# Patient Record
Sex: Female | Born: 1995 | Race: Black or African American | Hispanic: No | Marital: Single | Smoking: Never smoker
Health system: Southern US, Community
[De-identification: ages and names within clinical notes are randomized; demographics above are authoritative.]

---

## 2020-07-18 ENCOUNTER — Ambulatory Visit (HOSPITAL_COMMUNITY)
Admission: EM | Admit: 2020-07-18 | Discharge: 2020-07-18 | Disposition: A | Discharge status: Home / Self Care | Payer: BC Managed Care – PPO | Attending: Physician Assistant | Admitting: Physician Assistant

## 2020-07-18 ENCOUNTER — Encounter (HOSPITAL_COMMUNITY): Payer: Self-pay

## 2020-07-18 DIAGNOSIS — K529 Noninfective gastroenteritis and colitis, unspecified: Secondary | ICD-10-CM | POA: Diagnosis not present

## 2020-07-18 DIAGNOSIS — R197 Diarrhea, unspecified: Secondary | ICD-10-CM | POA: Insufficient documentation

## 2020-07-18 DIAGNOSIS — R112 Nausea with vomiting, unspecified: Secondary | ICD-10-CM

## 2020-07-18 LAB — CBC WITH DIFFERENTIAL/PLATELET
Abs Immature Granulocytes: 0.01 10*3/uL (ref 0.00–0.07)
Basophils Absolute: 0 10*3/uL (ref 0.0–0.1)
Basophils Relative: 1 %
Eosinophils Absolute: 0.1 10*3/uL (ref 0.0–0.5)
Eosinophils Relative: 1 %
HCT: 40.7 % (ref 36.0–46.0)
Hemoglobin: 14.2 g/dL (ref 12.0–15.0)
Immature Granulocytes: 0 %
Lymphocytes Relative: 24 %
Lymphs Abs: 0.9 10*3/uL (ref 0.7–4.0)
MCH: 29.5 pg (ref 26.0–34.0)
MCHC: 34.9 g/dL (ref 30.0–36.0)
MCV: 84.4 fL (ref 80.0–100.0)
Monocytes Absolute: 0.9 10*3/uL (ref 0.1–1.0)
Monocytes Relative: 22 %
Neutro Abs: 2 10*3/uL (ref 1.7–7.7)
Neutrophils Relative %: 52 %
Platelets: 251 10*3/uL (ref 150–400)
RBC: 4.82 MIL/uL (ref 3.87–5.11)
RDW: 11.9 % (ref 11.5–15.5)
WBC: 3.8 10*3/uL — ABNORMAL LOW (ref 4.0–10.5)
nRBC: 0 % (ref 0.0–0.2)

## 2020-07-18 LAB — POCT URINALYSIS DIPSTICK, ED / UC
Glucose, UA: NEGATIVE mg/dL
Hgb urine dipstick: NEGATIVE
Ketones, ur: 15 mg/dL — AB
Leukocytes,Ua: NEGATIVE
Nitrite: NEGATIVE
Protein, ur: NEGATIVE mg/dL
Specific Gravity, Urine: 1.025 (ref 1.005–1.030)
Urobilinogen, UA: 0.2 mg/dL (ref 0.0–1.0)
pH: 6 (ref 5.0–8.0)

## 2020-07-18 LAB — COMPREHENSIVE METABOLIC PANEL
ALT: 21 U/L (ref 0–44)
AST: 22 U/L (ref 15–41)
Albumin: 4.4 g/dL (ref 3.5–5.0)
Alkaline Phosphatase: 44 U/L (ref 38–126)
Anion gap: 7 (ref 5–15)
BUN: 11 mg/dL (ref 6–20)
CO2: 24 mmol/L (ref 22–32)
Calcium: 9.2 mg/dL (ref 8.9–10.3)
Chloride: 104 mmol/L (ref 98–111)
Creatinine, Ser: 0.95 mg/dL (ref 0.44–1.00)
GFR, Estimated: >60 mL/min (ref >60)
Glucose, Bld: 95 mg/dL (ref 70–99)
Potassium: 3.5 mmol/L (ref 3.5–5.1)
Sodium: 135 mmol/L (ref 135–145)
Total Bilirubin: 0.8 mg/dL (ref 0.3–1.2)
Total Protein: 7.9 g/dL (ref 6.5–8.1)

## 2020-07-18 LAB — POC URINE PREG, ED: Preg Test, Ur: NEGATIVE

## 2020-07-18 MED ORDER — FAMOTIDINE 20 MG PO TABS: 20.0000 mg | ORAL_TABLET | Freq: Two times a day (BID) | ORAL | 0 refills | Status: AC

## 2020-07-18 MED ORDER — ONDANSETRON 8 MG PO TBDP: 8.0000 mg | ORAL_TABLET | Freq: Three times a day (TID) | ORAL | 0 refills | Status: AC | PRN

## 2020-07-18 MED ORDER — ONDANSETRON 4 MG PO TBDP
4.0000 mg | ORAL_TABLET | Freq: Once | ORAL | Status: AC
  Administered 2020-07-18: 4 mg via ORAL

## 2020-07-18 MED ORDER — ONDANSETRON 4 MG PO TBDP
ORAL_TABLET | ORAL | Status: AC
  Filled 2020-07-18: qty 1

## 2020-07-18 NOTE — ED Provider Notes (Signed)
[facility]    CSN: [id number] Arrival date & time: 07/18/20  1142      History   Chief Complaint Chief Complaint  Patient presents with   Abdominal Pain   Nausea   Emesis   Diarrhea    HPI Kelsey Morales is a 25 y.o. female.   Patient presents today with a 2-day history of abdominal symptoms.  Reports generalized abdominal pain that is worse on the left side with pain, rated 3 on a 0-10 pain scale, described as aching, no aggravating or alleviating factors identified.  Patient reports associated diarrhea described as 5+ watery bowel movements per day without blood or mucus.  She reports persistent nausea with one episode of emesis but denies any hematemesis.  She denies any known sick contacts, recent antibiotic use, medication changes, recent travel, suspicious food intake.  Denies history of gastrointestinal disorder.  She does report taking a detox prior to symptom onset and wonders if this could have caused abdominal symptoms.  Reports active ingredient in the detox with fruit pectin.  She has been able to drink fluids regularly and has no concern for dehydration.  She did take an at-home pregnancy test that was negative and has IUD in place but is open to repeat pregnancy testing if appropriate.   History reviewed. No pertinent past medical history.  There are no problems to display for this patient.   History reviewed. No pertinent surgical history.  OB History   No obstetric history on file.      Home Medications    Prior to Admission medications   Medication Sig Start Date End Date Taking? Authorizing Provider  famotidine (PEPCID) 20 MG tablet Take 1 tablet (20 mg total) by mouth 2 (two) times daily. 07/18/20  Yes Lindbergh Winkles K, PA-C  ondansetron (ZOFRAN ODT) 8 MG disintegrating tablet Take 1 tablet (8 mg total) by mouth every 8 (eight) hours as needed for nausea or vomiting. 07/18/20  Yes Melicia Esqueda, Noberto Retort, PA-C  levonorgestrel (KYLEENA) 19.5 MG IUD Kyleena  17.5 mcg/24 hrs (48yrs) 19.5mg  intrauterine device  Device supplied by care center.    [provider]    Family History Family History  Problem Relation Age of Onset   Healthy Mother    Healthy Father     Social History Social History   Tobacco Use   Smoking status: Never   Smokeless tobacco: Never  Vaping Use   Vaping Use: Never used  Substance Use Topics   Alcohol use: Yes   Drug use: Not Currently     Allergies   Patient has no allergy information on record.   Review of Systems Review of Systems  Constitutional:  Positive for appetite change. Negative for activity change, fatigue and fever.  Respiratory:  Negative for cough and shortness of breath.   Cardiovascular:  Negative for chest pain.  Gastrointestinal:  Positive for abdominal pain, diarrhea, nausea and vomiting. Negative for blood in stool.  Musculoskeletal:  Negative for arthralgias and myalgias.  Neurological:  Negative for dizziness, light-headedness and headaches.    Physical Exam Triage Vital Signs ED Triage Vitals  Enc Vitals Group     BP 07/18/20 1210 107/74     Pulse Rate 07/18/20 1210 79     Resp 07/18/20 1210 16     Temp 07/18/20 1210 98.5 F (36.9 C)     Temp Source 07/18/20 1210 Oral     SpO2 07/18/20 1210 97 %     Weight --  Height --      Head Circumference --      Peak Flow --      Pain Score 07/18/20 1205 3     Pain Loc --      Pain Edu? --      Excl. in GC? --    No data found.  Updated Vital Signs BP 107/74 (BP Location: Left Arm)   Pulse 79   Temp 98.5 F (36.9 C) (Oral)   Resp 16   SpO2 97%   Visual Acuity Right Eye Distance:   Left Eye Distance:   Bilateral Distance:    Right Eye Near:   Left Eye Near:    Bilateral Near:     Physical Exam Vitals reviewed.  Constitutional:      General: She is awake. She is not in acute distress.    Appearance: Normal appearance. She is normal weight. She is not ill-appearing.     Comments: Very pleasant  female appears stated age in no acute distress sitting comfortably in exam room  HENT:     Head: Normocephalic and atraumatic.     Mouth/Throat:     Mouth: Mucous membranes are moist.     Pharynx: Uvula midline. No oropharyngeal exudate or posterior oropharyngeal erythema.  Cardiovascular:     Rate and Rhythm: Normal rate and regular rhythm.     Heart sounds: Normal heart sounds, S1 normal and S2 normal. No murmur heard. Pulmonary:     Effort: Pulmonary effort is normal.     Breath sounds: Normal breath sounds. No wheezing, rhonchi or rales.     Comments: Clear to auscultation bilaterally Abdominal:     General: Bowel sounds are normal.     Palpations: Abdomen is soft.     Tenderness: There is abdominal tenderness in the left upper quadrant and left lower quadrant. There is no right CVA tenderness, left CVA tenderness, guarding or rebound.     Comments: Mild tenderness palpation over left abdomen.  No evidence of acute abdomen on physical exam.  Psychiatric:        Behavior: Behavior is cooperative.     UC Treatments / Results  Labs (all labs ordered are listed, but only abnormal results are displayed) Labs Reviewed  POCT URINALYSIS DIPSTICK, ED / UC - Abnormal; Notable for the following components:      Result Value   Bilirubin Urine SMALL (*)    Ketones, ur 15 (*)    All other components within normal limits  CBC WITH DIFFERENTIAL/PLATELET  COMPREHENSIVE METABOLIC PANEL  POC URINE PREG, ED    EKG   Radiology No results found.  Procedures Procedures (including critical care time)  Medications Ordered in UC Medications  ondansetron (ZOFRAN-ODT) disintegrating tablet 4 mg (4 mg Oral Given 07/18/20 1259)    Initial Impression / Assessment and Plan / UC Course  I have reviewed the triage vital signs and the nursing notes.  Pertinent labs & imaging results that were available during my care of the patient were reviewed by me and considered in my medical decision  making (see chart for details).      Vital signs and physical exam reassuring today; no indication for emergent evaluation or imaging.  Urine pregnancy test was negative.  UA showed evidence patient has not been eating but no significant dehydration.  CBC and CMP obtained today-results pending.  Patient was given 1 dose of Zofran with minimal improvement of symptoms.  She was started on Pepcid twice daily and  given higher dose of Zofran to be used up to 3 times a day as needed for nausea and vomiting symptoms.  Recommend she drink plenty of fluid.  Encouraged her to eat a bland diet and avoid spicy/acidic/fatty foods.  Discussed that we are unable to obtain any imaging so if she has persistent or worsening abdominal symptoms including pain she needs to go to the emergency room.  Discussed alarm symptoms that warrant emergent evaluation.  Strict return precautions given to which patient expressed understanding.  Final Clinical Impressions(s) / UC Diagnoses   Final diagnoses:  Nausea vomiting and diarrhea  Gastroenteritis     Discharge Instructions      Take Pepcid twice a day to help coat your stomach.  Use Zofran up to 3 times a day as needed for nausea and vomiting symptoms.  Drink plenty of fluid.  Eat a bland diet and avoid spicy/acidic/fatty foods.  You have any worsening symptoms including persistent abdominal pain you need to go to the emergency room to consider a CT scan as we discussed.  We will contact you if your lab work is abnormal.  Please follow-up with primary care provider within 1 week.     ED Prescriptions     Medication Sig Dispense Auth. Provider   ondansetron (ZOFRAN ODT) 8 MG disintegrating tablet Take 1 tablet (8 mg total) by mouth every 8 (eight) hours as needed for nausea or vomiting. 20 tablet Arlanda Shiplett K, PA-C   famotidine (PEPCID) 20 MG tablet Take 1 tablet (20 mg total) by mouth 2 (two) times daily. 60 tablet Anthoney Sheppard, Noberto Retort, PA-C      PDMP not reviewed  this encounter.   Jeani Hawking, PA-C 07/18/20 1317

## 2020-07-18 NOTE — ED Triage Notes (Signed)
pt states she is not able to keep food or drinks down, has had some vomitting, with diarrhea. She is having some abdominal cramping. She has been drinking a lot of water to stay hydrated.   Started: 2 days ago  Interventions: none

## 2020-07-18 NOTE — Discharge Instructions (Addendum)
Take Pepcid twice a day to help coat your stomach.  Use Zofran up to 3 times a day as needed for nausea and vomiting symptoms.  Drink plenty of fluid.  Eat a bland diet and avoid spicy/acidic/fatty foods.  You have any worsening symptoms including persistent abdominal pain you need to go to the emergency room to consider a CT scan as we discussed.  We will contact you if your lab work is abnormal.  Please follow-up with primary care provider within 1 week.

## 2020-10-22 ENCOUNTER — Other Ambulatory Visit: Payer: Self-pay

## 2020-10-22 ENCOUNTER — Ambulatory Visit (HOSPITAL_COMMUNITY)
Admission: EM | Admit: 2020-10-22 | Discharge: 2020-10-22 | Disposition: A | Discharge status: Home / Self Care | Payer: BC Managed Care – PPO | Attending: Urgent Care | Admitting: Urgent Care

## 2020-10-22 ENCOUNTER — Encounter (HOSPITAL_COMMUNITY): Payer: Self-pay | Admitting: Emergency Medicine

## 2020-10-22 DIAGNOSIS — J4530 Mild persistent asthma, uncomplicated: Secondary | ICD-10-CM

## 2020-10-22 DIAGNOSIS — R0981 Nasal congestion: Secondary | ICD-10-CM

## 2020-10-22 DIAGNOSIS — J069 Acute upper respiratory infection, unspecified: Secondary | ICD-10-CM

## 2020-10-22 MED ORDER — CETIRIZINE HCL 10 MG PO TABS: 10.0000 mg | ORAL_TABLET | Freq: Every day | ORAL | 0 refills | Status: AC

## 2020-10-22 MED ORDER — PREDNISONE 10 MG PO TABS: 30.0000 mg | ORAL_TABLET | Freq: Every day | ORAL | 0 refills | Status: AC

## 2020-10-22 MED ORDER — BENZONATATE 100 MG PO CAPS: 100.0000 mg | ORAL_CAPSULE | Freq: Three times a day (TID) | ORAL | 0 refills | Status: AC | PRN

## 2020-10-22 MED ORDER — PSEUDOEPHEDRINE HCL 30 MG PO TABS: 30.0000 mg | ORAL_TABLET | Freq: Three times a day (TID) | ORAL | 0 refills | Status: AC | PRN

## 2020-10-22 MED ORDER — PROMETHAZINE-DM 6.25-15 MG/5ML PO SYRP: 5.0000 mL | ORAL_SOLUTION | Freq: Every evening | ORAL | 0 refills | Status: AC | PRN

## 2020-10-22 NOTE — ED Triage Notes (Signed)
Pt is present today with nasal congestion, body aches, right ear pressure, and fever.Pt states sx started Sunday

## 2020-10-22 NOTE — ED Provider Notes (Addendum)
[facility]   MRN: [id number] DOB: 1995-12-25  Subjective:   Yurianna Tusing is a 25 y.o. female presenting for 4-day history of persistent sinus pressure, chest pressure, sinus congestion, body aches, malaise and fatigue, right ear fullness.  No fever, chest pain, shortness of breath, wheezing.  Patient does have a history of asthma, uses her albuterol inhaler as needed.  Has not tried any other medications for relief.  Did a COVID test at home and does not want any repeats.  Does not smoke cigarettes.  No current facility-administered medications for this encounter.  Current Outpatient Medications:    famotidine (PEPCID) 20 MG tablet, Take 1 tablet (20 mg total) by mouth 2 (two) times daily., Disp: 60 tablet, Rfl: 0   levonorgestrel (KYLEENA) 19.5 MG IUD, Kyleena 17.5 mcg/24 hrs (73yrs) 19.5mg  intrauterine device  Device supplied by care center., Disp: , Rfl:    ondansetron (ZOFRAN ODT) 8 MG disintegrating tablet, Take 1 tablet (8 mg total) by mouth every 8 (eight) hours as needed for nausea or vomiting., Disp: 20 tablet, Rfl: 0   No Known Allergies  History reviewed. No pertinent past medical history.   History reviewed. No pertinent surgical history.  Family History  Problem Relation Age of Onset   Healthy Mother    Healthy Father     Social History   Tobacco Use   Smoking status: Never   Smokeless tobacco: Never  Vaping Use   Vaping Use: Never used  Substance Use Topics   Alcohol use: Yes   Drug use: Not Currently    ROS   Objective:   Vitals: BP 108/76   Pulse 78   Temp 98.2 F (36.8 C)   Resp 18   SpO2 96%   Physical Exam Constitutional:      General: She is not in acute distress.    Appearance: Normal appearance. She is well-developed. She is not ill-appearing, toxic-appearing or diaphoretic.  HENT:     Head: Normocephalic and atraumatic.     Right Ear: Tympanic membrane and ear canal normal. No drainage or tenderness. No middle ear  effusion. Tympanic membrane is not erythematous.     Left Ear: Tympanic membrane and ear canal normal. No drainage or tenderness.  No middle ear effusion. Tympanic membrane is not erythematous.     Nose: Congestion present. No rhinorrhea.     Comments: Nasal mucosa boggy and edematous.    Mouth/Throat:     Mouth: Mucous membranes are moist. No oral lesions.     Pharynx: No pharyngeal swelling, oropharyngeal exudate, posterior oropharyngeal erythema or uvula swelling.     Tonsils: No tonsillar exudate or tonsillar abscesses.  Eyes:     General: No scleral icterus.       Right eye: No discharge.        Left eye: No discharge.     Extraocular Movements: Extraocular movements intact.     Right eye: Normal extraocular motion.     Left eye: Normal extraocular motion.     Conjunctiva/sclera: Conjunctivae normal.     Pupils: Pupils are equal, round, and reactive to light.  Cardiovascular:     Rate and Rhythm: Normal rate and regular rhythm.     Pulses: Normal pulses.     Heart sounds: Normal heart sounds. No murmur heard.   No friction rub. No gallop.  Pulmonary:     Effort: Pulmonary effort is normal. No respiratory distress.     Breath sounds: Normal breath sounds. No  stridor. No wheezing, rhonchi or rales.  Musculoskeletal:     Cervical back: Normal range of motion and neck supple.  Lymphadenopathy:     Cervical: No cervical adenopathy.  Skin:    General: Skin is warm and dry.     Findings: No rash.  Neurological:     General: No focal deficit present.     Mental Status: She is alert and oriented to person, place, and time.  Psychiatric:        Mood and Affect: Mood normal.        Behavior: Behavior normal.        Thought Content: Thought content normal.     Assessment and Plan :   PDMP not reviewed this encounter.  1. Viral URI with cough   2. Nasal congestion   3. Mild persistent asthma without complication    Patient declined respiratory testing. Deferred imaging given  clear cardiopulmonary exam, hemodynamically stable vital signs.  Requested aggressive management and therefore I offered an oral prednisone course given her history of asthma to help address her chest pressure, sinus pressure and respiratory symptoms.  Maintain albuterol inhaler. Use supportive care otherwise for a viral upper respiratory infection. Counseled patient on potential for adverse effects with medications prescribed/recommended today, ER and return-to-clinic precautions discussed, patient verbalized understanding.     Wallis Bamberg, PA-C 10/22/20 1245

## 2020-12-19 ENCOUNTER — Emergency Department (HOSPITAL_COMMUNITY)
Admission: EM | Admit: 2020-12-19 | Discharge: 2020-12-19 | Disposition: A | Discharge status: Home / Self Care | Payer: No Typology Code available for payment source | Attending: Emergency Medicine | Admitting: Emergency Medicine

## 2020-12-19 ENCOUNTER — Encounter (HOSPITAL_COMMUNITY): Payer: Self-pay | Admitting: Emergency Medicine

## 2020-12-19 ENCOUNTER — Emergency Department (HOSPITAL_COMMUNITY): Payer: No Typology Code available for payment source

## 2020-12-19 ENCOUNTER — Other Ambulatory Visit: Payer: Self-pay

## 2020-12-19 DIAGNOSIS — S90852A Superficial foreign body, left foot, initial encounter: Secondary | ICD-10-CM | POA: Diagnosis not present

## 2020-12-19 DIAGNOSIS — W25XXXA Contact with sharp glass, initial encounter: Secondary | ICD-10-CM | POA: Insufficient documentation

## 2020-12-19 DIAGNOSIS — Y9301 Activity, walking, marching and hiking: Secondary | ICD-10-CM | POA: Diagnosis not present

## 2020-12-19 DIAGNOSIS — S99922A Unspecified injury of left foot, initial encounter: Secondary | ICD-10-CM | POA: Diagnosis present

## 2020-12-19 MED ORDER — LIDOCAINE-EPINEPHRINE (PF) 2 %-1:200000 IJ SOLN
20.0000 mL | Freq: Once | INTRAMUSCULAR | Status: AC
  Administered 2020-12-19: 20 mL
  Filled 2020-12-19: qty 20

## 2020-12-19 MED ORDER — IBUPROFEN 600 MG PO TABS: 600.0000 mg | ORAL_TABLET | Freq: Four times a day (QID) | ORAL | 0 refills | Status: AC | PRN

## 2020-12-19 MED ORDER — CEPHALEXIN 500 MG PO CAPS: ORAL_CAPSULE | ORAL | 0 refills | Status: AC

## 2020-12-19 NOTE — ED Triage Notes (Signed)
Pt states she stepped on glass with R foot.  Went to an [facility] and was told it was too deep and to come to ED.  Bandage in place.

## 2020-12-19 NOTE — ED Provider Notes (Signed)
[facility] EMERGENCY DEPARTMENT Provider Note   CSN: [id number] Arrival date & time: 12/19/20  1353     History Chief Complaint  Patient presents with   glass in foot    Kelsey Morales is a 25 y.o. female.  The history is provided by the patient and medical records. No language interpreter was used.   25 year old female presenting requesting for removal of foreign body from left foot.  Patient reports she was walking downtown last night barefoot and accidentally stepped on a piece of glass.  She went to urgent care today in which provided did attempt to remove the glass without success.  Patient was referred to podiatry which she did call the office but was recommended to come to the ER for further evaluation.  She is up-to-date with tetanus.  She does complain of sharp achy pain to the affected area.  Pain is mild at this time.  Pain is nonradiating.  History reviewed. No pertinent past medical history.  There are no problems to display for this patient.   History reviewed. No pertinent surgical history.   OB History   No obstetric history on file.     Family History  Problem Relation Age of Onset   Healthy Mother    Healthy Father     Social History   Tobacco Use   Smoking status: Never   Smokeless tobacco: Never  Vaping Use   Vaping Use: Never used  Substance Use Topics   Alcohol use: Yes   Drug use: Not Currently    Home Medications Prior to Admission medications   Medication Sig Start Date End Date Taking? Authorizing Provider  benzonatate (TESSALON) 100 MG capsule Take 1-2 capsules (100-200 mg total) by mouth 3 (three) times daily as needed for cough. 10/22/20   Wallis Bamberg, PA-C  cetirizine (ZYRTEC ALLERGY) 10 MG tablet Take 1 tablet (10 mg total) by mouth daily. 10/22/20   Wallis Bamberg, PA-C  famotidine (PEPCID) 20 MG tablet Take 1 tablet (20 mg total) by mouth 2 (two) times daily. 07/18/20   Raspet, Noberto Retort, PA-C  levonorgestrel (KYLEENA)  19.5 MG IUD Kyleena 17.5 mcg/24 hrs (52yrs) 19.5mg  intrauterine device  Device supplied by care center.    [provider]  ondansetron (ZOFRAN ODT) 8 MG disintegrating tablet Take 1 tablet (8 mg total) by mouth every 8 (eight) hours as needed for nausea or vomiting. 07/18/20   Raspet, Noberto Retort, PA-C  predniSONE (DELTASONE) 10 MG tablet Take 3 tablets (30 mg total) by mouth daily with breakfast. 10/22/20   Wallis Bamberg, PA-C  promethazine-dextromethorphan (PROMETHAZINE-DM) 6.25-15 MG/5ML syrup Take 5 mLs by mouth at bedtime as needed for cough. 10/22/20   Wallis Bamberg, PA-C  pseudoephedrine (SUDAFED) 30 MG tablet Take 1 tablet (30 mg total) by mouth every 8 (eight) hours as needed for congestion. 10/22/20   Wallis Bamberg, PA-C    Allergies    Patient has no known allergies.  Review of Systems   Review of Systems  Constitutional:  Negative for fever.  Skin:  Positive for wound.   Physical Exam Updated Vital Signs BP 139/85 (BP Location: Right Arm)    Pulse 74    Temp 98.2 F (36.8 C) (Oral)    Resp 18    SpO2 100%   Physical Exam Vitals and nursing note reviewed.  Constitutional:      General: She is not in acute distress.    Appearance: She is well-developed.  HENT:  Head: Atraumatic.  Eyes:     Conjunctiva/sclera: Conjunctivae normal.  Pulmonary:     Effort: Pulmonary effort is normal.  Musculoskeletal:        General: Tenderness (Left foot: Small incision noted to mid sole, mild tenderness to palpation no obvious foreign body appreciated.) present.     Cervical back: Neck supple.  Skin:    Findings: No rash.  Neurological:     Mental Status: She is alert.  Psychiatric:        Mood and Affect: Mood normal.    ED Results / Procedures / Treatments   Labs (all labs ordered are listed, but only abnormal results are displayed) Labs Reviewed - No data to display  EKG None  Radiology DG Foot Complete Left  Result Date: 12/19/2020 CLINICAL DATA:  Patient fell in  stepped on broken glass. EXAM: LEFT FOOT - COMPLETE 3+ VIEW COMPARISON:  None. FINDINGS: There is no evidence of fracture or dislocation. There is no evidence of arthropathy or other focal bone abnormality. There is a small radiopaque density in the soft tissues of the inferior midfoot best seen on lateral view and most likely representing glass given clinical history. IMPRESSION: 1. No acute fracture or dislocation. 2. Small radiopaque density in the soft tissues of the inferior midfoot most likely representing glass given clinical history. Electronically Signed   By: Emmaline Kluver M.D.   On: 12/19/2020 15:33    Procedures .Foreign Body Removal  Date/Time: 12/19/2020 4:19 PM Performed by: Fayrene Helper, PA-C Authorized by: Fayrene Helper, PA-C  Consent: Verbal consent obtained. Risks and benefits: risks, benefits and alternatives were discussed Consent given by: patient Patient understanding: patient states understanding of the procedure being performed Patient consent: the patient's understanding of the procedure matches consent given Procedure consent: procedure consent matches procedure scheduled Imaging studies: imaging studies available Anesthesia: local infiltration  Anesthesia: Local Anesthetic: lidocaine 2% with epinephrine Anesthetic total: 4 mL  Sedation: Patient sedated: no  Complexity: simple 0 objects recovered. Objects recovered: 0 Post-procedure assessment: foreign body not removed Patient tolerance: patient tolerated the procedure well with no immediate complications Comments: Attempted to remove retained glass without success despite multiple attempts.      Medications Ordered in ED Medications  lidocaine-EPINEPHrine (XYLOCAINE W/EPI) 2 %-1:200000 (PF) injection 20 mL (20 mLs Infiltration Given 12/19/20 1555)    ED Course  I have reviewed the triage vital signs and the nursing notes.  Pertinent labs & imaging results that were available during my care of the  patient were reviewed by me and considered in my medical decision making (see chart for details).    MDM Rules/Calculators/A&P                         BP 139/85 (BP Location: Right Arm)    Pulse 74    Temp 98.2 F (36.8 C) (Oral)    Resp 18    SpO2 100%      Final Clinical Impression(s) / ED Diagnoses Final diagnoses:  None    Rx / DC Orders ED Discharge Orders     None      3:45 PM Patient excellently stepped on a piece of glass when she was walking barefoot last night around downtown.  She went to urgent care initially who did attempt to remove it without success.  She was recommended to come toBe seen by podiatry but the office recommend patient come to the ER instead.  X-ray did confirm a  foreign body to the affected area.  We will attempt to retrieve the piece of glass.  4:21 PM Despite attempting to remove the glass, I was unable to remove the f/b.  Will prescribe keflex, wound care instruction and referral to podiatry for further care.     Fayrene Helper, PA-C 12/19/20 1645    Curatolo, Adam, DO 12/19/20 2345

## 2020-12-19 NOTE — Discharge Instructions (Addendum)
We have attempted to remove retained glass in your left foot without success.  Please take antibiotic as prescribed.  Call and follow up with foot specialist for further care.

## 2020-12-19 NOTE — ED Provider Notes (Signed)
Emergency Medicine Provider Triage Evaluation Note  Kelsey Morales , a 25 y.o. female  was evaluated in triage.  Pt complains of glass in her left foot.  States that same happened when she was walking around downtown barefoot last night.  Last tetanus in July.  Went to urgent care where the wound and attempted to get the glass out but were not successful.  Recommended going to the ER for further management.  Review of Systems  Positive: Glass in foot Negative: Fevers, chills  Physical Exam  BP 139/85 (BP Location: Right Arm)    Pulse 74    Temp 98.2 F (36.8 C) (Oral)    Resp 18    SpO2 100%  Gen:   Awake, no distress  Resp:  Normal effort  MSK:   Moves extremities without difficulty  Other:  Incision noted to the bottom of the left foot, no visualized glass.  Medical Decision Making  Medically screening exam initiated at 2:44 PM.  Appropriate orders placed.  Kelsey Morales was informed that the remainder of the evaluation will be completed by another provider, this initial triage assessment does not replace that evaluation, and the importance of remaining in the ED until their evaluation is complete.     Kelsey Morales 12/19/20 1446    Kelsey Mulders, MD 12/23/20 1321

## 2020-12-19 NOTE — ED Notes (Signed)
Mom Kelsey Morales [phone] would like an update

## 2020-12-19 NOTE — ED Provider Notes (Signed)
I personally evaluated the patient during the encounter and completed a history, physical, procedures, medical decision making to contribute to the overall care of the patient and decision making for the patient briefly, the patient is a 25 y.o. female left foot pain.  Patient stepped on likely broken glass.  Was intoxicated last night when this happened.  Appears to likely have some glass in the midfoot.  Attempt by PA to remove foreign body was unsuccessful.  Overall tetanus shot is up-to-date.  We will start her on antibiotics.  We will have her follow-up with podiatry.  Suspect that she will do fine if this foreign body is not removed but we will have her follow-up for wound reevaluation.  Discharged in good condition.  This chart was dictated using voice recognition software.  Despite best efforts to proofread,  errors can occur which can change the documentation meaning.     EKG Interpretation None            Kelsey Norfolk, DO 12/19/20 1621

## 2020-12-21 ENCOUNTER — Telehealth: Payer: Self-pay | Admitting: Urology

## 2020-12-21 ENCOUNTER — Other Ambulatory Visit: Payer: Self-pay

## 2020-12-21 ENCOUNTER — Ambulatory Visit (INDEPENDENT_AMBULATORY_CARE_PROVIDER_SITE_OTHER): Payer: No Typology Code available for payment source | Admitting: Podiatry

## 2020-12-21 ENCOUNTER — Ambulatory Visit (INDEPENDENT_AMBULATORY_CARE_PROVIDER_SITE_OTHER): Payer: No Typology Code available for payment source

## 2020-12-21 ENCOUNTER — Encounter: Payer: Self-pay | Admitting: Podiatry

## 2020-12-21 DIAGNOSIS — S90852A Superficial foreign body, left foot, initial encounter: Secondary | ICD-10-CM

## 2020-12-21 NOTE — Telephone Encounter (Signed)
DOS 12/25/20  EX FOREIGN BODY LEFT --- 28192  [facility] EFFECTIVE DATE - 10/03/20   PER [employer] WEBSITE FOR CPT CODE 48185 Notification or Prior Authorization is not required for the requested services   Decision ID #:[id number]   [employer] EFFECTIVE DATE - 09/03/09   SPOKE WITH JUSTIN WITH [employer] AND HER STATED THAT FOR CPT CODE 44695 NO PRIOR AUTH IS REQUIRED.  REF # [id number]

## 2020-12-22 ENCOUNTER — Encounter: Payer: Self-pay | Admitting: Podiatry

## 2020-12-22 NOTE — Progress Notes (Signed)
°  Subjective:  Patient ID: Kelsey Morales, female    DOB: 1995-01-10,  MRN: [id number]  Chief Complaint  Patient presents with   Foot Injury    NP L stepped on glass saturday still inbedded in foot     25 y.o. female presents with the above complaint. History confirmed with patient.  She stepped on a broken beer bottle barefoot currently taking Keflex  Objective:  Physical Exam: warm, good capillary refill, no trophic changes or ulcerative lesions, normal DP and PT pulses, and normal sensory exam. Left Foot: Midfoot laceration plantar   Radiographs: Multiple views x-ray of the left foot: Foreign body approximately 11 mm x 4 mm still present on x-ray No major change in position since films taken in the ER Assessment:   1. Foreign body in left foot, initial encounter      Plan:  Patient was evaluated and treated and all questions answered.  I reviewed the x-rays and clinical findings with her.  I recommended excision of the foreign body.  She was willing to try this in the office today.  Following sterile prep with Betadine and sterile instrumentation and a local field block with 3 cc each of 2% lidocaine and 0.5% Marcaine plain with epinephrine I attempted to locate the foreign body.  We tried this for approximately 10 minutes and I was unable to locate any evidence of it.  I recommend we abandon the procedure and I irrigated the wound and loosely approximated with a 3-0 nylon.  Recommend we plan to excise in the operating room with live fluoroscopic guidance.  Informed consent was signed and reviewed.  Surgical be scheduled for this Friday.  Continue Keflex for now.   Surgical plan:  Procedure: -Excision of foreign body left foot  Location: -[facility]  Anesthesia plan: -IV sedation with local  Postoperative pain plan: - Tylenol 1000 mg every 6 hours, ibuprofen 600 mg every 6 hours, gabapentin 300 mg every 8 hours x5 days, oxycodone 5 mg 1-2 tabs every 6 hours only as needed  DVT  prophylaxis: -None required  WB Restrictions / DME needs: -NWB in surgical shoe   No follow-ups on file.

## 2020-12-30 ENCOUNTER — Other Ambulatory Visit: Payer: Self-pay | Admitting: Podiatry

## 2020-12-30 DIAGNOSIS — S90852A Superficial foreign body, left foot, initial encounter: Secondary | ICD-10-CM

## 2020-12-30 MED ORDER — HYDROCODONE-ACETAMINOPHEN 5-325 MG PO TABS: 1.0000 | ORAL_TABLET | Freq: Four times a day (QID) | ORAL | 0 refills | Status: AC | PRN

## 2020-12-30 MED ORDER — AMOXICILLIN-POT CLAVULANATE 875-125 MG PO TABS: 1.0000 | ORAL_TABLET | Freq: Two times a day (BID) | ORAL | 0 refills | Status: AC

## 2020-12-30 NOTE — Progress Notes (Signed)
12/30/20 removal of glass left foot

## 2020-12-31 ENCOUNTER — Encounter: Payer: No Typology Code available for payment source | Admitting: Podiatry

## 2021-01-05 ENCOUNTER — Telehealth: Payer: Self-pay | Admitting: *Deleted

## 2021-01-05 NOTE — Telephone Encounter (Signed)
Patient is calling because she is still having feeling in toes and feet of pins and needles,numbness in the toes, had glass removed from foot 1 week ago. Please advise.

## 2021-01-05 NOTE — Telephone Encounter (Signed)
Patient wants to know if she should be moving her toes or just keep foot elevated and warm?  She would like to know what you advise her to do?

## 2021-01-05 NOTE — Telephone Encounter (Signed)
Still normal at this point, likely due to swelling from surgery. Will evaluate it on Thursday

## 2021-01-05 NOTE — Telephone Encounter (Signed)
Patient states she has been applying heat to her foot because her toes are so cold and she can not feel them.  She hasnt been applying any pressure to it, she has kept it wrapped, but she has not been applying ice.

## 2021-01-05 NOTE — Telephone Encounter (Signed)
Elevate foot and apply ice to it. No heat. It is in her post op instructions from surgery

## 2021-01-07 ENCOUNTER — Ambulatory Visit (INDEPENDENT_AMBULATORY_CARE_PROVIDER_SITE_OTHER): Payer: No Typology Code available for payment source | Admitting: Podiatry

## 2021-01-07 ENCOUNTER — Other Ambulatory Visit: Payer: Self-pay

## 2021-01-07 DIAGNOSIS — S90852D Superficial foreign body, left foot, subsequent encounter: Secondary | ICD-10-CM

## 2021-01-11 ENCOUNTER — Encounter: Payer: Self-pay | Admitting: Podiatry

## 2021-01-11 NOTE — Progress Notes (Signed)
°  Subjective:  Patient ID: Kelsey Morales, female    DOB: Aug 18, 1995,  MRN: [id number]  Chief Complaint  Patient presents with   Routine Post Op    POV #1 DOS 12/30/2020 REMOVAL OF GLASS LT FOOT      26 y.o. female returns for post-op check.  Doing well the numbness and swelling is improving now that she started using ice and not heat  Review of Systems: Negative except as noted in the HPI. Denies N/V/F/Ch.   Objective:  There were no vitals filed for this visit. There is no height or weight on file to calculate BMI. Constitutional Well developed. Well nourished.  Vascular Foot warm and well perfused. Capillary refill normal to all digits.  Calf is soft and supple, no posterior calf or knee pain, negative Homans' sign  Neurologic Normal speech. Oriented to person, place, and time. Epicritic sensation to light touch grossly present bilaterally.  Dermatologic Skin healing well without signs of infection. Skin edges well coapted without signs of infection.  Orthopedic: Tenderness to palpation noted about the surgical site.    Assessment:   1. Foreign body in left foot, subsequent encounter    Plan:  Patient was evaluated and treated and all questions answered.  S/p foot surgery left -Progressing as expected post-operatively. -WB Status: NWB in surgical shoe with knee scooter -Sutures: Removed next week. -Medications: Complete antibiotics no refills required -Foot redressed.  No follow-ups on file.

## 2021-01-14 ENCOUNTER — Encounter: Payer: No Typology Code available for payment source | Admitting: Podiatry

## 2021-01-21 ENCOUNTER — Encounter: Payer: Self-pay | Admitting: Podiatry

## 2021-01-21 ENCOUNTER — Other Ambulatory Visit: Payer: Self-pay

## 2021-01-21 ENCOUNTER — Ambulatory Visit (INDEPENDENT_AMBULATORY_CARE_PROVIDER_SITE_OTHER): Payer: No Typology Code available for payment source | Admitting: Podiatry

## 2021-01-21 DIAGNOSIS — S90852D Superficial foreign body, left foot, subsequent encounter: Secondary | ICD-10-CM

## 2021-01-21 NOTE — Progress Notes (Signed)
°  Subjective:  Patient ID: Kelsey Morales, female    DOB: 11-Dec-1995,  MRN: [id number]  Chief Complaint  Patient presents with   Routine Post Op      POV #2 DOS 12/30/2020 REMOVAL OF GLASS LT FOOT      26 y.o. female returns for post-op check.  Having some numbness and swelling but continues to improve  Review of Systems: Negative except as noted in the HPI. Denies N/V/F/Ch.   Objective:  There were no vitals filed for this visit. There is no height or weight on file to calculate BMI. Constitutional Well developed. Well nourished.  Vascular Foot warm and well perfused. Capillary refill normal to all digits.  Calf is soft and supple, no posterior calf or knee pain, negative Homans' sign  Neurologic Normal speech. Oriented to person, place, and time. Epicritic sensation to light touch grossly present bilaterally.  Light touch sensation loss distal to the incision but is normal at the toes  Dermatologic Skin healing well without signs of infection. Skin edges well coapted without signs of infection.  Incision is well-healed and not hypertrophic  Orthopedic: T she has minimal tenderness to palpation noted about the surgical site.    Assessment:   1. Foreign body in left foot, subsequent encounter    Plan:  Patient was evaluated and treated and all questions answered.  S/p foot surgery left -Overall doing well she should continue using the silicone ointment and I removed her sutures today.  Steri-Strips applied.  May transition to shoe gear in 1 week as tolerated.  The numbness she is experiencing I think is likely related to the incision and injury and hopefully should resolve she has good sensation at the plantar portions of the toes and digital sulcus I do not think the lateral plantar nerve is damaged.  I will see her back as needed at this point.  No follow-ups on file.

## 2022-02-11 ENCOUNTER — Ambulatory Visit: Payer: Self-pay

## 2022-02-18 ENCOUNTER — Ambulatory Visit (HOSPITAL_BASED_OUTPATIENT_CLINIC_OR_DEPARTMENT_OTHER): Payer: No Typology Code available for payment source | Admitting: Orthopaedic Surgery

## 2022-02-18 ENCOUNTER — Ambulatory Visit (HOSPITAL_BASED_OUTPATIENT_CLINIC_OR_DEPARTMENT_OTHER): Payer: No Typology Code available for payment source

## 2022-02-18 DIAGNOSIS — M25571 Pain in right ankle and joints of right foot: Secondary | ICD-10-CM | POA: Diagnosis not present

## 2022-02-18 DIAGNOSIS — M25371 Other instability, right ankle: Secondary | ICD-10-CM

## 2022-02-18 NOTE — Progress Notes (Signed)
Chief Complaint: Right ankle pain     History of Present Illness:    Kelsey Morales is a 27 y.o. female presents with multiple years of right ankle pain.  Of note she does have a history of multiple recurrent ankle sprains over the years suffering these every several months.  These are quite disabling when they occur and she has had one as recently as January and February of this year.  At this time she has an extremely difficult time doing basic things like wearing heels.  She previously was [employer] for which she sustained multiple ankle sprains during that time.  She experiences popping and clicking about the lateral ankle as well.  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  She has previously tried bracing and rehab for strengthening of the peroneals with limited improvement    Surgical History:   none  PMH/PSH/Family History/Social History/Meds/Allergies:   No past medical history on file. No past surgical history on file. Social History   Socioeconomic History   Marital status: Single    Spouse name: Not on file   Number of children: Not on file   Years of education: Not on file   Highest education level: Not on file  Occupational History   Not on file  Tobacco Use   Smoking status: Never   Smokeless tobacco: Never  Vaping Use   Vaping Use: Never used  Substance and Sexual Activity   Alcohol use: Yes   Drug use: Not Currently   Sexual activity: Yes  Other Topics Concern   Not on file  Social History Narrative   Not on file   Social Determinants of Health   Financial Resource Strain: Not on file  Food Insecurity: Not on file  Transportation Needs: Not on file  Physical Activity: Not on file  Stress: Not on file  Social Connections: Not on file   Family History  Problem Relation Age of Onset   Healthy Mother    Healthy Father    No Known Allergies Current Outpatient Medications  Medication Sig  Dispense Refill   benzonatate (TESSALON) 100 MG capsule Take 1-2 capsules (100-200 mg total) by mouth 3 (three) times daily as needed for cough. 60 capsule 0   cephALEXin (KEFLEX) 500 MG capsule 2 caps po bid x 7 days 28 capsule 0   cetirizine (ZYRTEC ALLERGY) 10 MG tablet Take 1 tablet (10 mg total) by mouth daily. 30 tablet 0   escitalopram (LEXAPRO) 10 MG tablet Take 10 mg by mouth daily.     famotidine (PEPCID) 20 MG tablet Take 1 tablet (20 mg total) by mouth 2 (two) times daily. 60 tablet 0   ibuprofen (ADVIL) 600 MG tablet Take 1 tablet (600 mg total) by mouth every 6 (six) hours as needed for moderate pain. 30 tablet 0   levonorgestrel (KYLEENA) 19.5 MG IUD Kyleena 17.5 mcg/24 hrs (4yr) 19.529mintrauterine device  Device supplied by care center.     nystatin-triamcinolone ointment (MYCOLOG) nystatin-triamcinolone 100,000 unit/gram-0.1 % topical ointment  APPLY TO THE AFFECTED AREA(S) BY TOPICAL ROUTE 2 TIMES PER DAY     ondansetron (ZOFRAN ODT) 8 MG disintegrating tablet Take 1 tablet (8 mg total) by mouth every 8 (eight) hours as needed for nausea or vomiting. 20 tablet 0   predniSONE (  DELTASONE) 10 MG tablet Take 3 tablets (30 mg total) by mouth daily with breakfast. 15 tablet 0   promethazine-dextromethorphan (PROMETHAZINE-DM) 6.25-15 MG/5ML syrup Take 5 mLs by mouth at bedtime as needed for cough. 100 mL 0   pseudoephedrine (SUDAFED) 30 MG tablet Take 1 tablet (30 mg total) by mouth every 8 (eight) hours as needed for congestion. 30 tablet 0   No current facility-administered medications for this visit.   No results found.  Review of Systems:   A ROS was performed including pertinent positives and negatives as documented in the HPI.  Physical Exam :   Constitutional: NAD and appears stated age Neurological: Alert and oriented Psych: Appropriate affect and cooperative There were no vitals taken for this visit.   Comprehensive Musculoskeletal Exam:     Right Left  Gait  Normal  Musculoskeletal Exam    Deformity No deformity No deformity  Tenderness Peroneal tendon, ATFL, Achilles none  Skin    Abrasions None None  Blisters None None  Range of Motion    Ankle Dorsiflexion 30 35  Ankle Plantarflexion 45 45  Subtalar Joint Inversion/Eversion Pain with laxity Normal  Stability    Dislocations None None  Subluxations or Laxity Positive perineal None  Muscle Strength    Single Heel Raise Able Able  Sensation    Sural Nerve Dist. Normal Normal  Saphenous Nerve Dist. Normal Normal  Tibial Nerve Dist. Normal Normal  Deep Peroneal Nerve Dist. Normal Normal  Superficial Peroneal Nerve Dist. Normal Normal  Cardiovascular     Varicosites None  None  DP Artery Pulse Palpable Palpable  Capillary Refill <2 sec <2 sec  Special Tests:  Positive peroneal snapping with eversion and dorsiflexion     Imaging:   Xray (3 views right ankle): Normal   I personally reviewed and interpreted the radiographs.   Assessment:   27 y.o. female with right ankle pain consistent with septic peroneal in the setting of recurrent ankle instability.  Given the fact that she does have recurrent ankle instability which is failed previous trials of peroneal strengthening as well as bracing, I do believe that she would benefit from an MRI at this time so we can rule out underlying ATFL tearing as well as any tearing of the peroneal tendons.  Will plan for an MRI of the right ankle and follow-up discuss results  Plan :    -Plan for MRI right ankle and follow-up to discuss results     I personally saw and evaluated the patient, and participated in the management and treatment plan.  [name], MD Attending Physician, Orthopedic Surgery  This document was dictated using Dragon voice recognition software. A reasonable attempt at proof reading has been made to minimize errors.

## 2022-03-08 ENCOUNTER — Ambulatory Visit
Admission: RE | Admit: 2022-03-08 | Discharge: 2022-03-08 | Disposition: A | Discharge status: Home / Self Care | Payer: 59 | Source: Ambulatory Visit | Attending: Orthopaedic Surgery | Admitting: Orthopaedic Surgery

## 2022-03-08 DIAGNOSIS — M25371 Other instability, right ankle: Secondary | ICD-10-CM

## 2022-03-10 ENCOUNTER — Other Ambulatory Visit: Payer: No Typology Code available for payment source

## 2022-03-18 ENCOUNTER — Ambulatory Visit (HOSPITAL_BASED_OUTPATIENT_CLINIC_OR_DEPARTMENT_OTHER): Payer: No Typology Code available for payment source | Admitting: Orthopaedic Surgery

## 2022-04-06 ENCOUNTER — Ambulatory Visit (HOSPITAL_BASED_OUTPATIENT_CLINIC_OR_DEPARTMENT_OTHER): Payer: 59 | Admitting: Orthopaedic Surgery

## 2022-04-06 DIAGNOSIS — M25571 Pain in right ankle and joints of right foot: Secondary | ICD-10-CM | POA: Diagnosis not present

## 2022-04-06 NOTE — Progress Notes (Signed)
Chief Complaint: Right ankle pain     History of Present Illness:   04/06/2022: Presents today for follow-up of her MRI report and right ankle.  At this point she is having persistent pain and instability about the right ankle.  Kelsey Morales is a 27 y.o. female presents with multiple years of right ankle pain.  Of note she does have a history of multiple recurrent ankle sprains over the years suffering these every several months.  These are quite disabling when they occur and she has had one as recently as January and February of this year.  At this time she has an extremely difficult time doing basic things like wearing heels.  She previously was [employer] for which she sustained multiple ankle sprains during that time.  She experiences popping and clicking about the lateral ankle as well.  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  She has previously tried bracing and rehab for strengthening of the peroneals with limited improvement    Surgical History:   none  PMH/PSH/Family History/Social History/Meds/Allergies:   No past medical history on file. No past surgical history on file. Social History   Socioeconomic History   Marital status: Single    Spouse name: Not on file   Number of children: Not on file   Years of education: Not on file   Highest education level: Not on file  Occupational History   Not on file  Tobacco Use   Smoking status: Never   Smokeless tobacco: Never  Vaping Use   Vaping Use: Never used  Substance and Sexual Activity   Alcohol use: Yes   Drug use: Not Currently   Sexual activity: Yes  Other Topics Concern   Not on file  Social History Narrative   Not on file   Social Determinants of Health   Financial Resource Strain: Not on file  Food Insecurity: Not on file  Transportation Needs: Not on file  Physical Activity: Not on file  Stress: Not on file  Social Connections: Not on file    Family History  Problem Relation Age of Onset   Healthy Mother    Healthy Father    No Known Allergies Current Outpatient Medications  Medication Sig Dispense Refill   benzonatate (TESSALON) 100 MG capsule Take 1-2 capsules (100-200 mg total) by mouth 3 (three) times daily as needed for cough. 60 capsule 0   cephALEXin (KEFLEX) 500 MG capsule 2 caps po bid x 7 days 28 capsule 0   cetirizine (ZYRTEC ALLERGY) 10 MG tablet Take 1 tablet (10 mg total) by mouth daily. 30 tablet 0   escitalopram (LEXAPRO) 10 MG tablet Take 10 mg by mouth daily.     famotidine (PEPCID) 20 MG tablet Take 1 tablet (20 mg total) by mouth 2 (two) times daily. 60 tablet 0   ibuprofen (ADVIL) 600 MG tablet Take 1 tablet (600 mg total) by mouth every 6 (six) hours as needed for moderate pain. 30 tablet 0   levonorgestrel (KYLEENA) 19.5 MG IUD Kyleena 17.5 mcg/24 hrs (44yrs) 19.5mg  intrauterine device  Device supplied by care center.     nystatin-triamcinolone ointment (MYCOLOG) nystatin-triamcinolone 100,000 unit/gram-0.1 % topical ointment  APPLY TO THE AFFECTED AREA(S) BY TOPICAL ROUTE 2 TIMES PER DAY     ondansetron (ZOFRAN ODT) 8  MG disintegrating tablet Take 1 tablet (8 mg total) by mouth every 8 (eight) hours as needed for nausea or vomiting. 20 tablet 0   predniSONE (DELTASONE) 10 MG tablet Take 3 tablets (30 mg total) by mouth daily with breakfast. 15 tablet 0   promethazine-dextromethorphan (PROMETHAZINE-DM) 6.25-15 MG/5ML syrup Take 5 mLs by mouth at bedtime as needed for cough. 100 mL 0   pseudoephedrine (SUDAFED) 30 MG tablet Take 1 tablet (30 mg total) by mouth every 8 (eight) hours as needed for congestion. 30 tablet 0   No current facility-administered medications for this visit.   No results found.  Review of Systems:   A ROS was performed including pertinent positives and negatives as documented in the HPI.  Physical Exam :   Constitutional: NAD and appears stated age Neurological: Alert and  oriented Psych: Appropriate affect and cooperative There were no vitals taken for this visit.   Comprehensive Musculoskeletal Exam:     Right Left  Gait Normal  Musculoskeletal Exam    Deformity No deformity No deformity  Tenderness Peroneal tendon, ATFL, Achilles none  Skin    Abrasions None None  Blisters None None  Range of Motion    Ankle Dorsiflexion 30 35  Ankle Plantarflexion 45 45  Subtalar Joint Inversion/Eversion Pain with laxity Normal  Stability    Dislocations None None  Subluxations or Laxity Positive perineal None  Muscle Strength    Single Heel Raise Able Able  Sensation    Sural Nerve Dist. Normal Normal  Saphenous Nerve Dist. Normal Normal  Tibial Nerve Dist. Normal Normal  Deep Peroneal Nerve Dist. Normal Normal  Superficial Peroneal Nerve Dist. Normal Normal  Cardiovascular     Varicosites None  None  DP Artery Pulse Palpable Palpable  Capillary Refill <2 sec <2 sec  Special Tests:  Positive peroneal snapping with eversion and dorsiflexion     Imaging:   Xray (3 views right ankle): Normal  MRI right ankle: There is evidence of a chronic appearing lateral ankle sprain with intrasubstance tearing of the peroneus brevis  I personally reviewed and interpreted the radiographs.   Assessment:   27 y.o. female with right ankle pain consistent with subluxating peroneals in the setting of recurrent ankle instability.  MRI is consistent with ankle instability with subluxating peroneal's with a shallow groove.  I did discuss treatment options.  At this time she has trialed multiple treatment modalities including physical therapy for peroneal strengthening.  None of these have improved for longer periods of time.  Given that we did discuss the role for possible ankle arthroscopy with Brostrm repair and peroneal groove deepening.  She would like to consider this and will consider reaching out later in May for possible surgical intervention in June  Plan :     -Plan for right ankle arthroscopy with Brostrm repair and peroneal groove deepening   After a lengthy discussion of treatment options, including risks, benefits, alternatives, complications of surgical and nonsurgical conservative options, the patient elected surgical repair.   The patient  is aware of the material risks  and complications including, but not limited to injury to adjacent structures, neurovascular injury, infection, numbness, bleeding, implant failure, thermal burns, stiffness, persistent pain, failure to heal, disease transmission from allograft, need for further surgery, dislocation, anesthetic risks, blood clots, risks of death,and others. The probabilities of surgical success and failure discussed with patient given their particular co-morbidities.The time and nature of expected rehabilitation and recovery was discussed.The patient's questions were all answered  preoperatively.  No barriers to understanding were noted. I explained the natural history of the disease process and Rx rationale.  I explained to the patient what I considered to be reasonable expectations given their personal situation.  The final treatment plan was arrived at through a shared patient decision making process model.      I personally saw and evaluated the patient, and participated in the management and treatment plan.  [name], MD Attending Physician, Orthopedic Surgery  This document was dictated using Dragon voice recognition software. A reasonable attempt at proof reading has been made to minimize errors.

## 2022-08-21 ENCOUNTER — Other Ambulatory Visit: Payer: Self-pay

## 2022-08-21 ENCOUNTER — Emergency Department (HOSPITAL_COMMUNITY)
Admission: EM | Admit: 2022-08-21 | Discharge: 2022-08-22 | Discharge status: Against Medical Advice | Payer: 59 | Attending: Emergency Medicine | Admitting: Emergency Medicine

## 2022-08-21 ENCOUNTER — Encounter (HOSPITAL_COMMUNITY): Payer: Self-pay

## 2022-08-21 DIAGNOSIS — Z5321 Procedure and treatment not carried out due to patient leaving prior to being seen by health care provider: Secondary | ICD-10-CM | POA: Insufficient documentation

## 2022-08-21 DIAGNOSIS — M542 Cervicalgia: Secondary | ICD-10-CM | POA: Diagnosis present

## 2022-08-21 DIAGNOSIS — R519 Headache, unspecified: Secondary | ICD-10-CM | POA: Diagnosis not present

## 2022-08-21 NOTE — ED Triage Notes (Signed)
Pt came in via POV d/t Lt sided neck pain that started last week, feels like a pulled muscle. Also reports getting a posterior HA that also causes light sensitivity & she feels in the lower back of her head. A/Ox4, rates pain 7/10 with no pain meds. Denies any n/v.

## 2022-08-22 ENCOUNTER — Other Ambulatory Visit: Payer: Self-pay

## 2022-08-22 ENCOUNTER — Emergency Department: Payer: 59

## 2022-08-22 ENCOUNTER — Emergency Department
Admission: EM | Admit: 2022-08-22 | Discharge: 2022-08-22 | Disposition: A | Discharge status: Home / Self Care | Payer: 59 | Attending: Emergency Medicine | Admitting: Emergency Medicine

## 2022-08-22 DIAGNOSIS — Y9239 Other specified sports and athletic area as the place of occurrence of the external cause: Secondary | ICD-10-CM | POA: Insufficient documentation

## 2022-08-22 DIAGNOSIS — Y9343 Activity, gymnastics: Secondary | ICD-10-CM | POA: Diagnosis not present

## 2022-08-22 DIAGNOSIS — R519 Headache, unspecified: Secondary | ICD-10-CM | POA: Diagnosis not present

## 2022-08-22 DIAGNOSIS — X500XXA Overexertion from strenuous movement or load, initial encounter: Secondary | ICD-10-CM | POA: Diagnosis not present

## 2022-08-22 DIAGNOSIS — M546 Pain in thoracic spine: Secondary | ICD-10-CM | POA: Insufficient documentation

## 2022-08-22 DIAGNOSIS — M542 Cervicalgia: Secondary | ICD-10-CM | POA: Insufficient documentation

## 2022-08-22 MED ORDER — IBUPROFEN 600 MG PO TABS
600.0000 mg | ORAL_TABLET | Freq: Once | ORAL | Status: AC
  Administered 2022-08-22: 600 mg via ORAL
  Filled 2022-08-22: qty 1

## 2022-08-22 MED ORDER — CYCLOBENZAPRINE HCL 5 MG PO TABS: 5.0000 mg | ORAL_TABLET | Freq: Three times a day (TID) | ORAL | 0 refills | Status: AC | PRN

## 2022-08-22 MED ORDER — ACETAMINOPHEN 500 MG PO TABS
1000.0000 mg | ORAL_TABLET | Freq: Once | ORAL | Status: AC
  Administered 2022-08-22: 1000 mg via ORAL
  Filled 2022-08-22: qty 2

## 2022-08-22 MED ORDER — CYCLOBENZAPRINE HCL 10 MG PO TABS
5.0000 mg | ORAL_TABLET | Freq: Once | ORAL | Status: AC
  Administered 2022-08-22: 5 mg via ORAL
  Filled 2022-08-22: qty 1

## 2022-08-22 NOTE — ED Provider Notes (Addendum)
[facility] Provider Note    Event Date/Time   First MD Initiated Contact with Patient 08/22/22 1210     (approximate)   History   Headache   HPI  Kelsey Morales is a 27 y.o. female presenting to the emergency department for evaluation of headache.  A few days ago, patient had onset of headache primarily in the back of her head, not sudden in onset.  Does report some associated sensitivity to light.  No fevers or chills.  She additionally reports some tightness throughout the muscles around her neck and upper back.  Did have a massage the day her symptoms started and did some heavy lifting at the gym the next day.  No neck manipulation.  No numbness, tingling, weakness.  No vision changes.  Has been taking ibuprofen with resolution of her headache.    Physical Exam   Triage Vital Signs: ED Triage Vitals  Encounter Vitals Group     BP 08/22/22 1147 107/76     Systolic BP Percentile --      Diastolic BP Percentile --      Pulse Rate 08/22/22 1147 88     Resp 08/22/22 1147 17     Temp 08/22/22 1147 98 F (36.7 C)     Temp src --      SpO2 08/22/22 1147 100 %     Weight --      Height --      Head Circumference --      Peak Flow --      Pain Score 08/22/22 1143 4     Pain Loc --      Pain Education --      Exclude from Growth Chart --     Most recent vital signs: Vitals:   08/22/22 1147  BP: 107/76  Pulse: 88  Resp: 17  Temp: 98 F (36.7 C)  SpO2: 100%     General: Awake, interactive  Neck:  No midline neck tenderness, there is readily reproducible tenderness over the paraspinous musculature over the bilateral lower neck into upper back CV:  Regular rate, good peripheral perfusion.  Resp:  Lungs clear, unlabored respirations.  Abd:  Soft, nondistended.  Neuro:  Alert and oriented, normal extraocular movements, symmetric facial movement, sensation intact over bilateral upper and lower extremities with 5 out of 5 strength.  Normal  finger-to-nose testing.   ED Results / Procedures / Treatments   Labs (all labs ordered are listed, but only abnormal results are displayed) Labs Reviewed - No data to display   EKG EKG independently reviewed interpreted by myself (ER attending) demonstrates:    RADIOLOGY Imaging independently reviewed and interpreted by myself demonstrates:  CT head without acute bleed on my review, formal radiology read pending at time of discharge  PROCEDURES:  Critical Care performed: No  Procedures   MEDICATIONS ORDERED IN ED: Medications  ibuprofen (ADVIL) tablet 600 mg (600 mg Oral Given 08/22/22 1238)  cyclobenzaprine (FLEXERIL) tablet 5 mg (5 mg Oral Given 08/22/22 1235)  acetaminophen (TYLENOL) tablet 1,000 mg (1,000 mg Oral Given 08/22/22 1350)     IMPRESSION / MDM / ASSESSMENT AND PLAN / ED COURSE  I reviewed the triage vital signs and the nursing notes.  Differential diagnosis includes, but is not limited to, suspect likely benign headache in the absence of fever, focal neurologic deficits, not sudden in onset, neck pain seems primarily musculoskeletal in etiology, low suspicion for cervical artery dissection in the absence of recent  trauma, focal neurologic deficits, no fever or meningismus suggestive of meningitis  Patient's presentation is most consistent with acute presentation with potential threat to life or bodily function.  27 year old female presenting to the emergency department for evaluation of headache and neck pain.  No significant recent trauma.  Vital signs and physical exam reassuring.  Patient does report headache is different than prior, so CT head was obtained.  No acute abnormality on my review.  There is been a delay in radiology interpretations, so patient was updated after symptomatic treatment.  Discussed continuing to wait for radiology read versus discharge and I will contact her should final read require further intervention or evaluation.  Patient would  like to go ahead and be discharged.  She can be contacted at [phone], okay to leave a voicemail. Will discharge with prescription for Flexeril.  Strict return precautions provided.  Patient discharged stable condition.   1505: Final radiology read is without acute intracranial pathology.  Continue plan for discharge.     FINAL CLINICAL IMPRESSION(S) / ED DIAGNOSES   Final diagnoses:  Acute nonintractable headache, unspecified headache type  Neck pain, acute     Rx / DC Orders   ED Discharge Orders          Ordered    cyclobenzaprine (FLEXERIL) 5 MG tablet  3 times daily PRN        08/22/22 1434             Note:  This document was prepared using Dragon voice recognition software and may include unintentional dictation errors.   Trinna Post, MD 08/22/22 1436    Trinna Post, MD 08/22/22 [phone]

## 2022-08-22 NOTE — Discharge Instructions (Addendum)
You were seen in the ER today for evaluation of your headache. Your exam here was fortunately reassuring. Return to the ER if you develop new or worsening headache, fever, changes in vision, difficulty walking, weakness, dizziness, confusion, vomiting, numbness, tingling, or any other new or concerning symptoms that you believe warrants immediate attention.

## 2022-08-22 NOTE — ED Triage Notes (Signed)
Pt comes with c/o migraine. Pt states she took some meds and some relief. Pt states she has hx of these.

## 2022-08-22 NOTE — ED Notes (Signed)
Pt called [facility] to go to a room. Pt could not be found.  

## 2022-12-30 IMAGING — CR DG FOOT COMPLETE 3+V*L*
3 series · 3 of 3 positions shown · non-contrast
Comparison: None.

CLINICAL DATA: Patient fell in stepped on broken glass.

EXAM:
LEFT FOOT - COMPLETE 3+ VIEW

[foot ap]
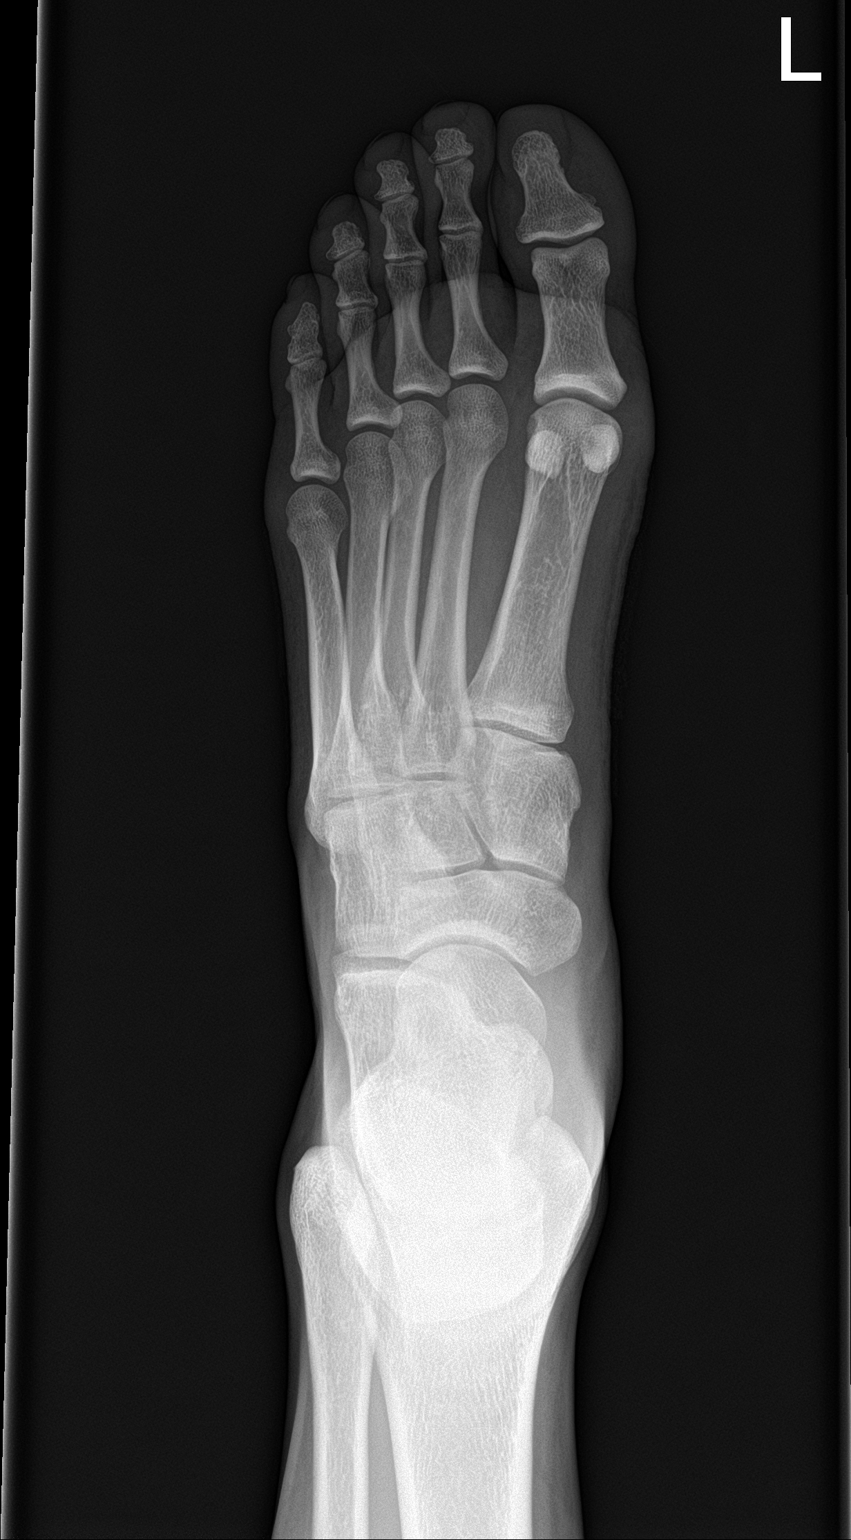

[foot obl]
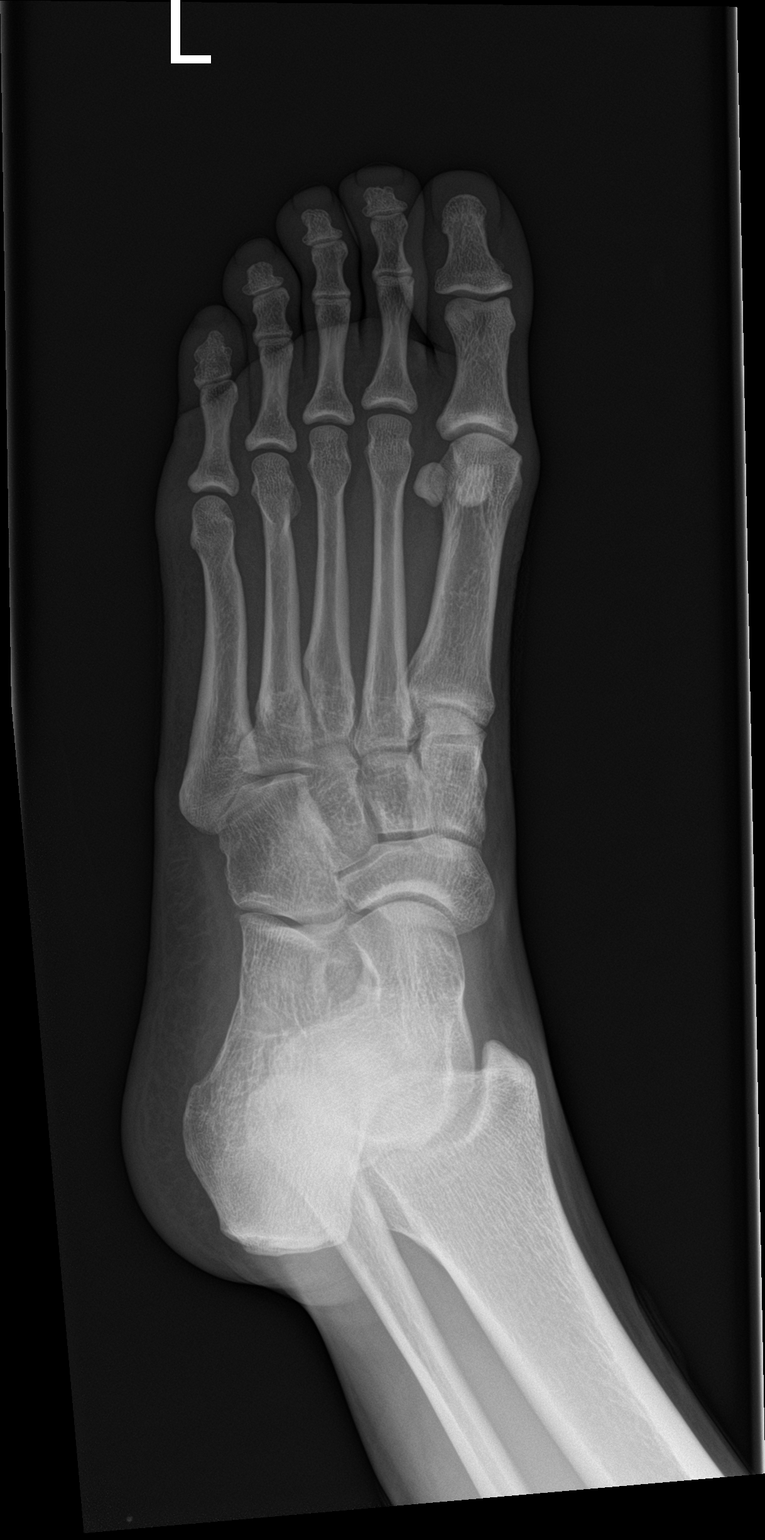

[foot lat]
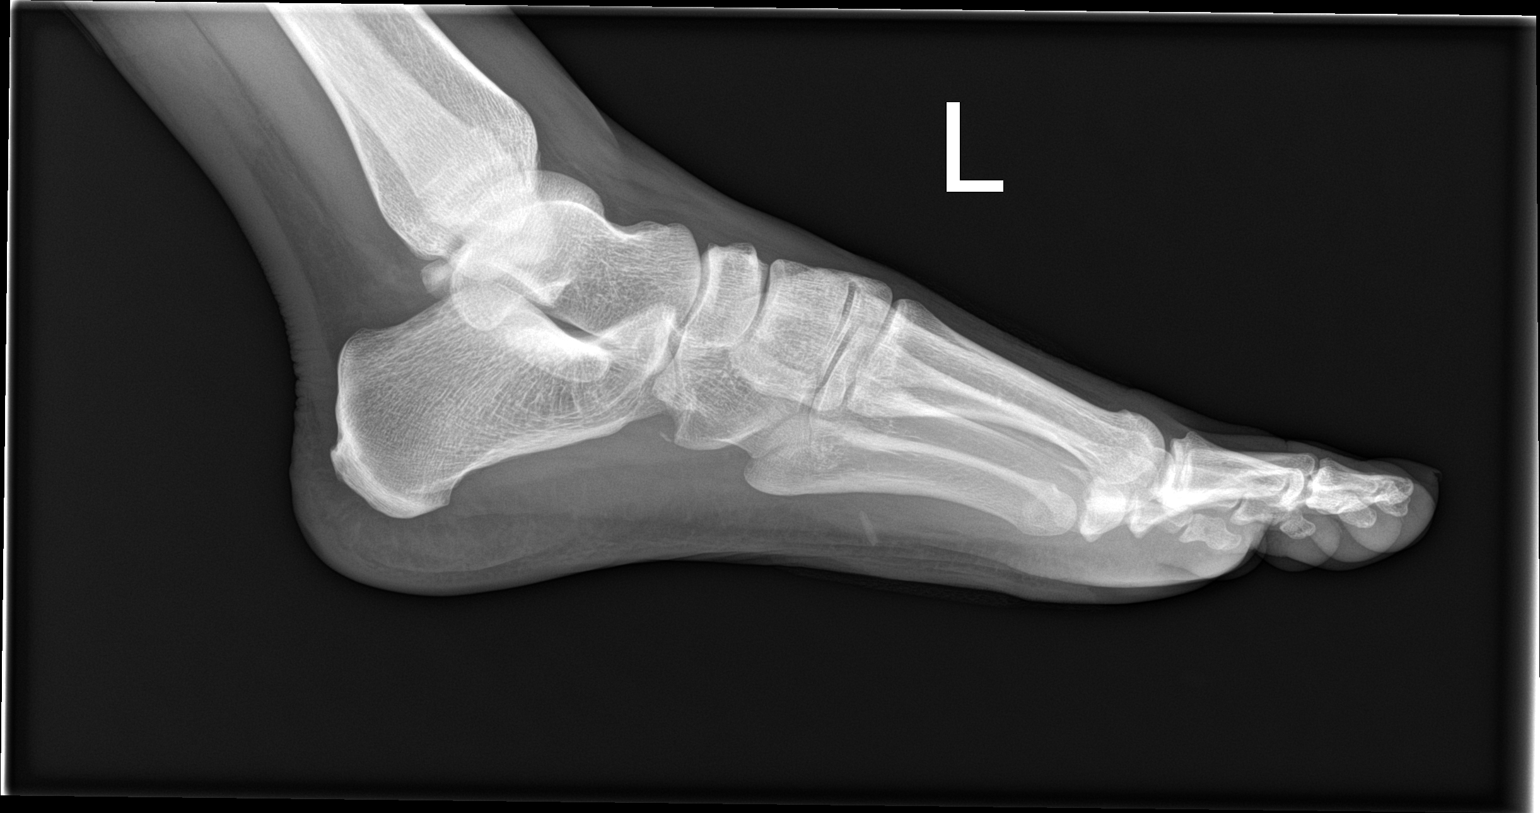

[3 of 3 positions shown; findings below may reference images not displayed]

FINDINGS: There is no evidence of fracture or dislocation. There is no
evidence of arthropathy or other focal bone abnormality. There is a
small radiopaque density in the soft tissues of the inferior midfoot
best seen on lateral view and most likely representing glass given
clinical history.
IMPRESSION: 1. No acute fracture or dislocation.
2. Small radiopaque density in the soft tissues of the inferior
midfoot most likely representing glass given clinical history.
